# Patient Record
Sex: Female | Born: 1995 | Race: Black or African American | Hispanic: No | Marital: Single | Smoking: Never smoker
Health system: Southern US, Community
[De-identification: ages and names within clinical notes are randomized; demographics above are authoritative.]

## PROBLEM LIST (undated history)

## (undated) DIAGNOSIS — L309 Dermatitis, unspecified: Secondary | ICD-10-CM

---

## 2019-07-25 ENCOUNTER — Other Ambulatory Visit: Payer: Self-pay

## 2019-07-25 ENCOUNTER — Emergency Department (HOSPITAL_COMMUNITY)
Admission: EM | Admit: 2019-07-25 | Discharge: 2019-07-25 | Disposition: A | Discharge status: Home / Self Care | Payer: Medicaid Other | Attending: Emergency Medicine | Admitting: Emergency Medicine

## 2019-07-25 ENCOUNTER — Encounter (HOSPITAL_COMMUNITY): Payer: Self-pay | Admitting: Emergency Medicine

## 2019-07-25 ENCOUNTER — Emergency Department (HOSPITAL_COMMUNITY): Payer: Medicaid Other

## 2019-07-25 DIAGNOSIS — R109 Unspecified abdominal pain: Secondary | ICD-10-CM | POA: Insufficient documentation

## 2019-07-25 DIAGNOSIS — M545 Low back pain, unspecified: Secondary | ICD-10-CM

## 2019-07-25 HISTORY — DX: Dermatitis, unspecified: L30.9

## 2019-07-25 LAB — COMPREHENSIVE METABOLIC PANEL
ALT: 14 U/L (ref 0–44)
AST: 16 U/L (ref 15–41)
Albumin: 4.1 g/dL (ref 3.5–5.0)
Alkaline Phosphatase: 51 U/L (ref 38–126)
Anion gap: 9 (ref 5–15)
BUN: 7 mg/dL (ref 6–20)
CO2: 23 mmol/L (ref 22–32)
Calcium: 9.2 mg/dL (ref 8.9–10.3)
Chloride: 104 mmol/L (ref 98–111)
Creatinine, Ser: 0.7 mg/dL (ref 0.44–1.00)
GFR calc Af Amer: >60 mL/min (ref >60)
GFR calc non Af Amer: >60 mL/min (ref >60)
Glucose, Bld: 87 mg/dL (ref 70–99)
Potassium: 3.8 mmol/L (ref 3.5–5.1)
Sodium: 136 mmol/L (ref 135–145)
Total Bilirubin: 0.8 mg/dL (ref 0.3–1.2)
Total Protein: 7.2 g/dL (ref 6.5–8.1)

## 2019-07-25 LAB — CBC WITH DIFFERENTIAL/PLATELET
Abs Immature Granulocytes: 0.02 10*3/uL (ref 0.00–0.07)
Basophils Absolute: 0 10*3/uL (ref 0.0–0.1)
Basophils Relative: 0 %
Eosinophils Absolute: 0.3 10*3/uL (ref 0.0–0.5)
Eosinophils Relative: 6 %
HCT: 40.8 % (ref 36.0–46.0)
Hemoglobin: 13.5 g/dL (ref 12.0–15.0)
Immature Granulocytes: 0 %
Lymphocytes Relative: 23 %
Lymphs Abs: 1.3 10*3/uL (ref 0.7–4.0)
MCH: 29.9 pg (ref 26.0–34.0)
MCHC: 33.1 g/dL (ref 30.0–36.0)
MCV: 90.3 fL (ref 80.0–100.0)
Monocytes Absolute: 0.4 10*3/uL (ref 0.1–1.0)
Monocytes Relative: 6 %
Neutro Abs: 3.7 10*3/uL (ref 1.7–7.7)
Neutrophils Relative %: 65 %
Platelets: 304 10*3/uL (ref 150–400)
RBC: 4.52 MIL/uL (ref 3.87–5.11)
RDW: 12.3 % (ref 11.5–15.5)
WBC: 5.7 10*3/uL (ref 4.0–10.5)
nRBC: 0 % (ref 0.0–0.2)

## 2019-07-25 LAB — URINALYSIS, ROUTINE W REFLEX MICROSCOPIC
Bilirubin Urine: NEGATIVE
Glucose, UA: NEGATIVE mg/dL
Hgb urine dipstick: NEGATIVE
Ketones, ur: NEGATIVE mg/dL
Leukocytes,Ua: NEGATIVE
Nitrite: NEGATIVE
Protein, ur: NEGATIVE mg/dL
Specific Gravity, Urine: 1.018 (ref 1.005–1.030)
pH: 6 (ref 5.0–8.0)

## 2019-07-25 LAB — POC URINE PREG, ED: Preg Test, Ur: NEGATIVE

## 2019-07-25 MED ORDER — METHOCARBAMOL 500 MG PO TABS: 500.0000 mg | ORAL_TABLET | Freq: Two times a day (BID) | ORAL | 0 refills | Status: AC | PRN

## 2019-07-25 MED ORDER — HYDROCODONE-ACETAMINOPHEN 5-325 MG PO TABS
1.0000 | ORAL_TABLET | Freq: Once | ORAL | Status: AC
  Administered 2019-07-25: 15:00:00 1 via ORAL
  Filled 2019-07-25: qty 1

## 2019-07-25 NOTE — ED Triage Notes (Signed)
Pt reports L lower back pain since Thursday. Pt reports receiving a massage on Thursday, having pain since. She was seen at her [place], had a UA done, negative. Pt reports taking ibuprofen with some relief.

## 2019-07-25 NOTE — Discharge Instructions (Signed)
It was my pleasure taking care of you today!   You have been seen in the Emergency Department today for back pain.   Continue ibuprofen as needed for pain. You can add in extra strength Tylenol for additional pain relief. Robaxin is your muscle relaxer to take as needed.  Your back pain should get better over the next 2 weeks. Please follow up with your doctor this week for a recheck if still having symptoms.  COLD THERAPY DIRECTIONS:  Ice or gel packs can be used to reduce both pain and swelling. Ice is the most helpful within the first 24 to 48 hours after an injury or flareup from overusing a muscle or joint.  Ice is effective, has very few side effects, and is safe for most people to use.    Return to the ED for worsening back pain, fever, weakness or numbness of either leg, or if you develop either (1) an inability to urinate or have bowel movements, or (2) loss of your ability to control your bathroom functions (if you start having "accidents"), or if you develop other new symptoms that concern you.

## 2019-07-25 NOTE — ED Provider Notes (Signed)
[facility] EMERGENCY DEPARTMENT Provider Note   CSN: [id number] Arrival date & time: 07/25/19  1231     History   Chief Complaint Chief Complaint  Patient presents with  . Back Pain    HPI Christina Becker is a 23 y.o. female.     The history is provided by the patient and medical records. No language interpreter was used.   Christina Becker is a 23 y.o. female  with no pertinent PMH who presents to the Emergency Department complaining of left sided low back pain radiating to left flank for 4-5 days.  Patient states that she did receive a massage on Thursday, but no other inciting event or trauma.  [facility] she had normal urinalysis done.  She was told that everything looked good and to take ibuprofen if she needs to for the pain.  She has been doing so, but providing her minimal relief.  She states that her pain is now more to the flank and it was earlier and feels like the pain is now moving towards her stomach.  Denies any dysuria. Patient denies upper back or neck pain. No fever, saddle anesthesia, weakness, numbness, no urinary retention/incontinence. No history of cancer, IVDU, or recent spinal procedures.   Past Medical History:  Diagnosis Date  . Eczema     There are no active problems to display for this patient.   History reviewed. No pertinent surgical history.   OB History   No obstetric history on file.      Home Medications    Prior to Admission medications   Medication Sig Start Date End Date Taking? Authorizing Provider  methocarbamol (ROBAXIN) 500 MG tablet Take 1 tablet (500 mg total) by mouth 2 (two) times daily as needed. 07/25/19   , Ozella Almond, PA-C    Family History No family history on file.  Social History Social History   Tobacco Use  . Smoking status: Never Smoker  . Smokeless tobacco: Never Used  Substance Use Topics  . Alcohol use: Yes    Comment: occasional  .  Drug use: Never     Allergies   Shellfish allergy   Review of Systems Review of Systems  Genitourinary: Positive for flank pain. Negative for dysuria and pelvic pain.  Musculoskeletal: Positive for back pain. Negative for neck pain.  All other systems reviewed and are negative.    Physical Exam Updated Vital Signs BP 113/74 (BP Location: Right Arm)   Pulse 71   Temp 98.6 F (37 C) (Oral)   Resp 16   Ht 5\' 4"  (1.626 m)   Wt 57.2 kg   LMP 06/29/2019 (Approximate)   SpO2 99%   BMI 21.63 kg/m   Physical Exam Vitals signs and nursing note reviewed.  Constitutional:      General: She is not in acute distress.    Appearance: She is well-developed.  HENT:     Head: Normocephalic and atraumatic.  Neck:     Musculoskeletal: Neck supple.  Cardiovascular:     Rate and Rhythm: Normal rate and regular rhythm.     Heart sounds: Normal heart sounds. No murmur.  Pulmonary:     Effort: Pulmonary effort is normal. No respiratory distress.     Breath sounds: Normal breath sounds.  Abdominal:     General: There is no distension.     Palpations: Abdomen is soft.     Comments: No overt CVA tenderness.  She has tender to the left flank without overlying skin changes.  No abdominal tenderness.  Skin:    General: Skin is warm and dry.  Neurological:     Mental Status: She is alert and oriented to person, place, and time.      ED Treatments / Results  Labs (all labs ordered are listed, but only abnormal results are displayed) Labs Reviewed  CBC WITH DIFFERENTIAL/PLATELET  COMPREHENSIVE METABOLIC PANEL  URINALYSIS, ROUTINE W REFLEX MICROSCOPIC  POC URINE PREG, ED    EKG None  Radiology Ct Renal Stone Study  Result Date: 07/25/2019 CLINICAL DATA:  23 year old female with acute LEFT abdominal flank pain for 4 days. EXAM: CT ABDOMEN AND PELVIS WITHOUT CONTRAST TECHNIQUE: Multidetector CT imaging of the abdomen and pelvis was performed following the standard protocol without  IV contrast. COMPARISON:  None. FINDINGS: Please note that parenchymal abnormalities may be missed without intravenous contrast. Lower chest: Unremarkable Hepatobiliary: The liver and gallbladder are unremarkable. No biliary dilatation. Pancreas: Unremarkable Spleen: Unremarkable Adrenals/Urinary Tract: The kidneys, adrenal glands and bladder are unremarkable. Stomach/Bowel: Stomach is within normal limits. The appendix is not identified but no inflammatory changes are noted within the pericecal region. No evidence of bowel wall thickening, distention, or inflammatory changes. Vascular/Lymphatic: No significant vascular findings are present. No enlarged abdominal or pelvic lymph nodes. Reproductive: Uterus and bilateral adnexa are unremarkable. Other: A trace amount of free pelvic fluid is noted and may be physiologic. Musculoskeletal: No bony abnormalities are identified. IMPRESSION: 1. Trace amount of free pelvic fluid which may be physiologic. Otherwise unremarkable CT of the abdomen and pelvis without contrast. Electronically Signed   By: Harmon PierJeffrey  Hu M.D.   On: 07/25/2019 17:03    Procedures Procedures (including critical care time)  Medications Ordered in ED Medications  HYDROcodone-acetaminophen (NORCO/VICODIN) 5-325 MG per tablet 1 tablet (1 tablet Oral Given 07/25/19 1437)     Initial Impression / Assessment and Plan / ED Course  I have reviewed the triage vital signs and the nursing notes.  Pertinent labs & imaging results that were available during my care of the patient were reviewed by me and considered in my medical decision making (see chart for details).       Christina Becker is a 23 y.o. female who presents to ED for low back and flank pain.  No red flag symptoms of back pain.  Neurovascularly intact.  No abdominal tenderness, but is tender to the flank.  Labs and urine unremarkable.  Renal study without stone or other acute pathology.  Likely MSK.  Will treat as such and have her  follow-up with PCP.  Reasons to return to the emergency department were discussed and all questions answered.    Final Clinical Impressions(s) / ED Diagnoses   Final diagnoses:  Acute left-sided low back pain without sciatica    ED Discharge Orders         Ordered    methocarbamol (ROBAXIN) 500 MG tablet  2 times daily PRN     07/25/19 1725           , Chase PicketJaime Pilcher, PA-C 07/25/19 1728    Milagros Lollykstra, Richard S, MD 07/26/19 [phone]

## 2020-09-21 ENCOUNTER — Ambulatory Visit (HOSPITAL_COMMUNITY)
Admission: EM | Admit: 2020-09-21 | Discharge: 2020-09-21 | Disposition: A | Discharge status: Home / Self Care | Payer: Medicaid Other

## 2020-09-21 ENCOUNTER — Other Ambulatory Visit: Payer: Self-pay

## 2021-03-22 IMAGING — CT CT RENAL STONE PROTOCOL
2 of 4 series · 16 of 46 positions shown, 18 images · non-contrast
Comparison: None.

CLINICAL DATA: 22-year-old female with acute LEFT abdominal flank
pain for 4 days.

EXAM:
CT ABDOMEN AND PELVIS WITHOUT CONTRAST
TECHNIQUE: Multidetector CT imaging of the abdomen and pelvis was performed
following the standard protocol without IV contrast.

[Series 3: renal stone 5.0 · axial · 0.68mm/px · z∈[+796,+1196]mm · 13 of 88 slices shown, 15 images]
[im 4/88  soft-tissue]
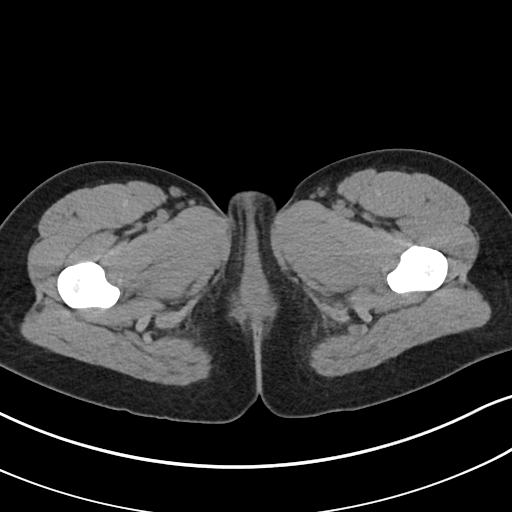
[im 4/88  bone]
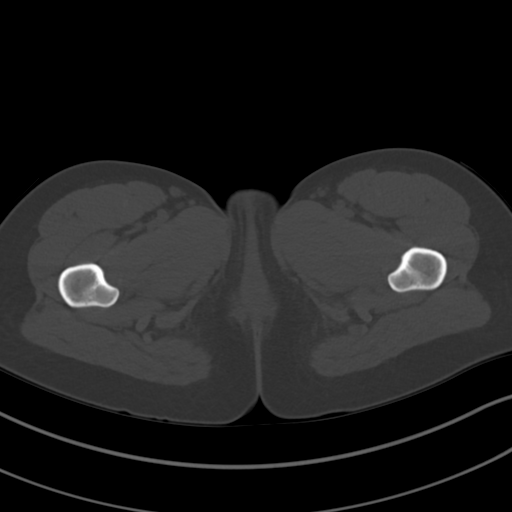
[im 11/88  soft-tissue]
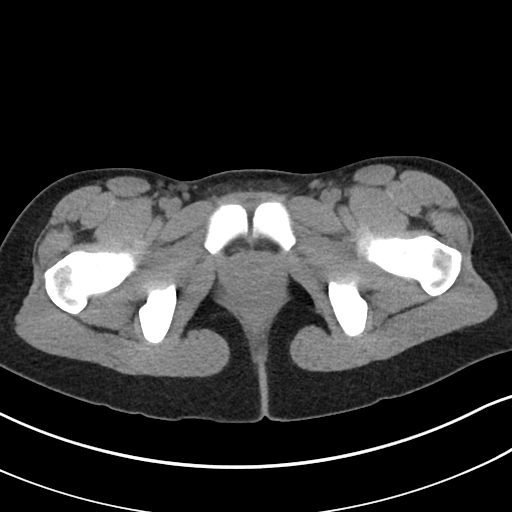
[im 17/88  soft-tissue]
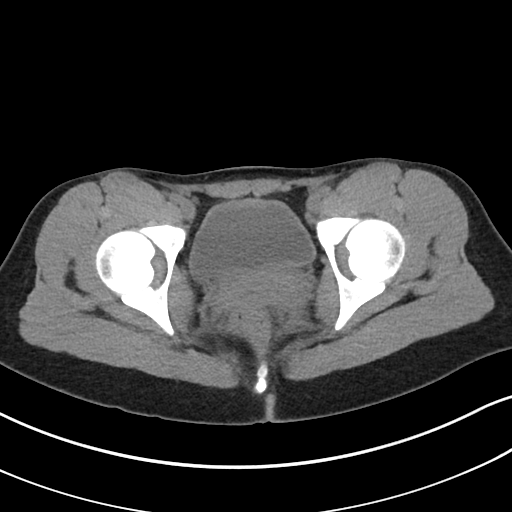
[im 24/88  soft-tissue]
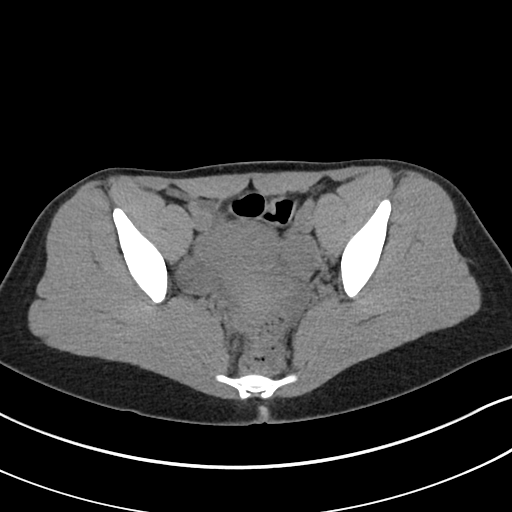
[im 31/88  soft-tissue]
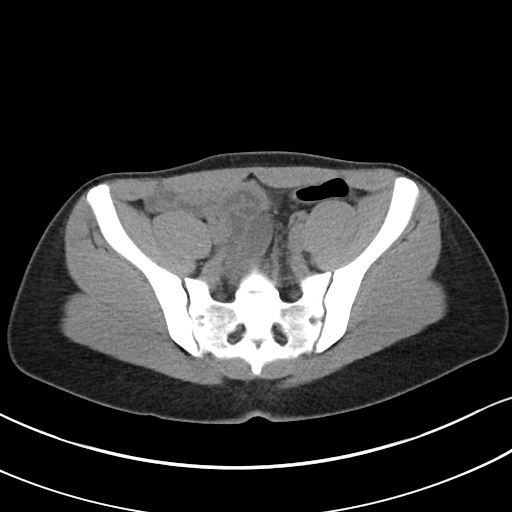
[im 37/88  soft-tissue]
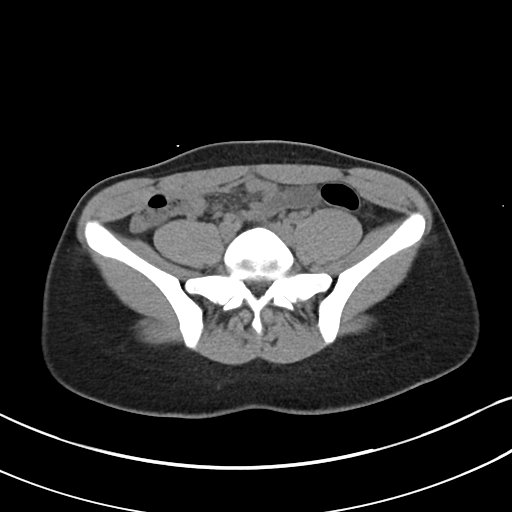
[im 44/88  soft-tissue]
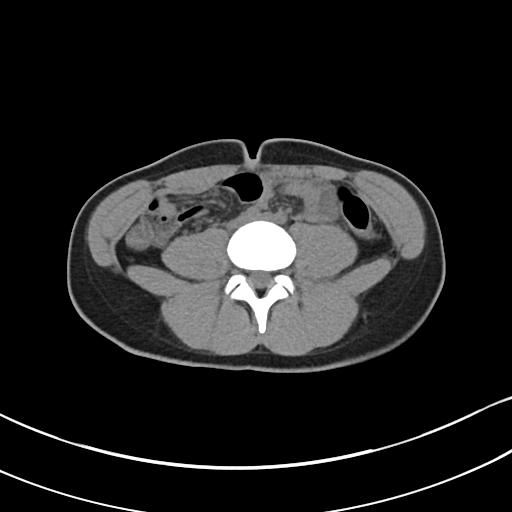
[im 51/88  soft-tissue]
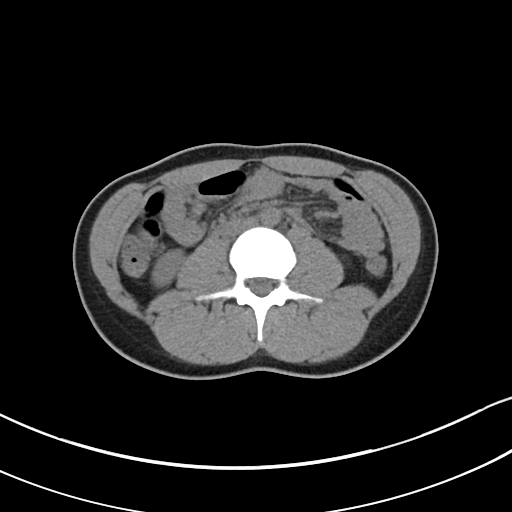
[im 57/88  soft-tissue]
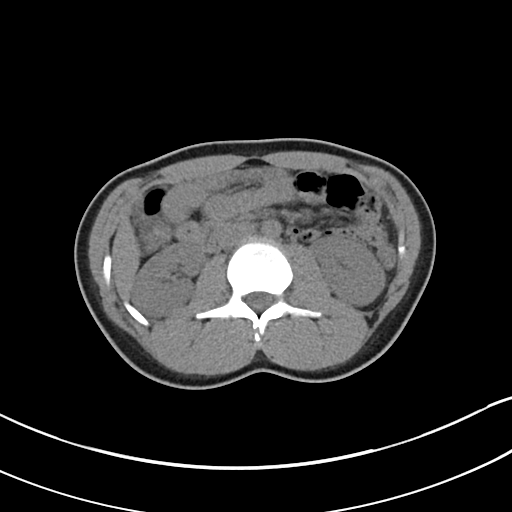
[im 57/88  bone]
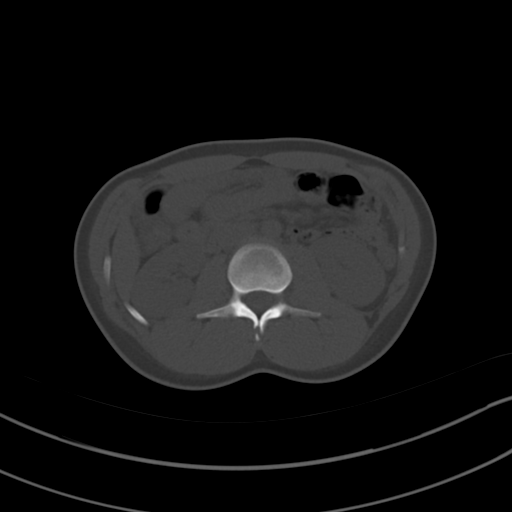
[im 64/88  soft-tissue]
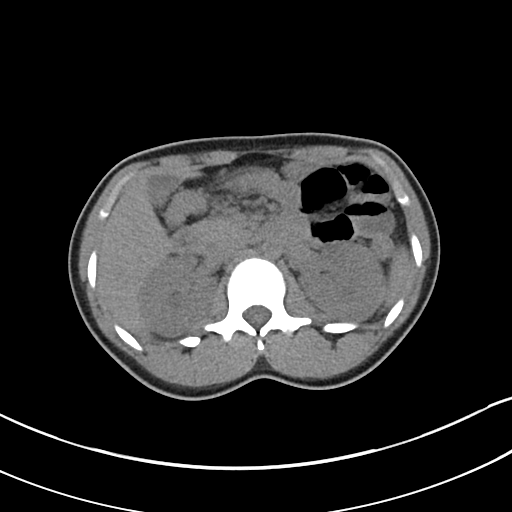
[im 71/88  soft-tissue]
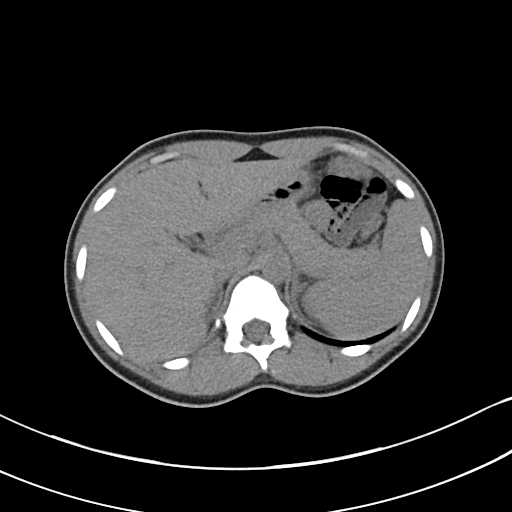
[im 77/88  soft-tissue]
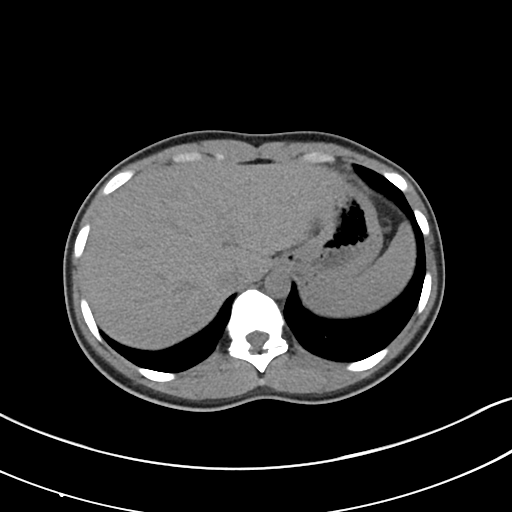
[im 84/88  soft-tissue]
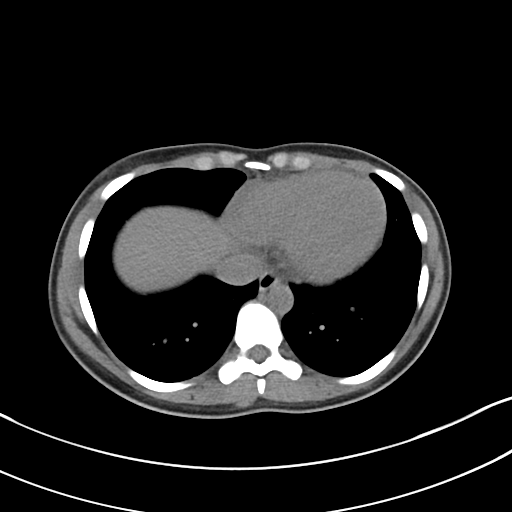

[Series 5: renal stone 3.0 cor · coronal · 0.64mm/px · 3 of 68 slices shown]
[im 23/68  soft-tissue]
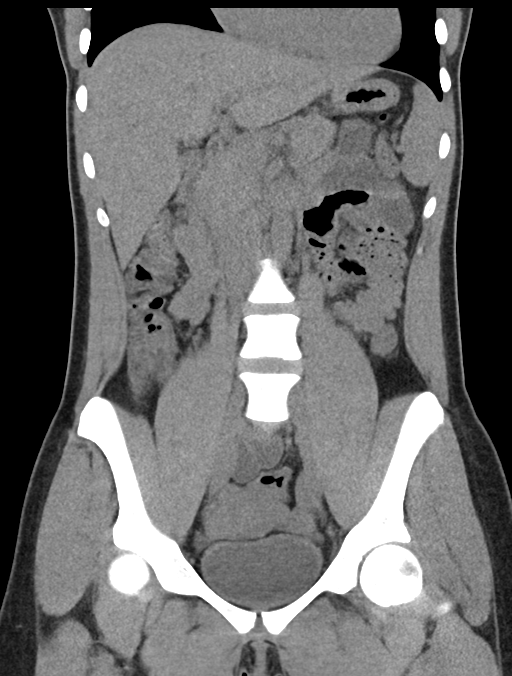
[im 30/68  soft-tissue]
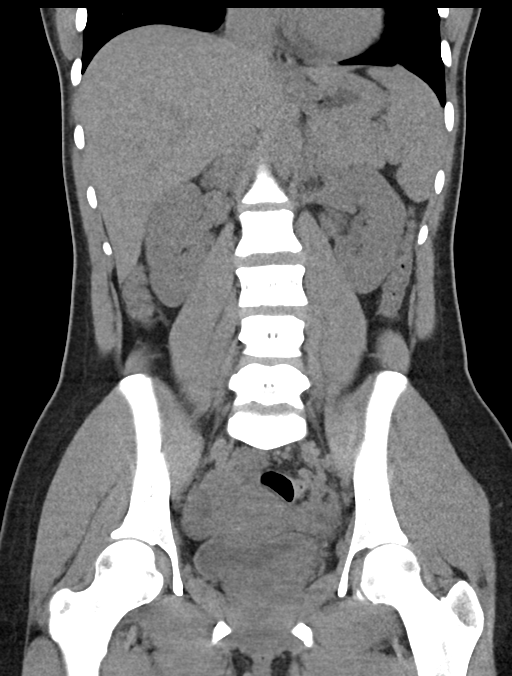
[im 38/68  soft-tissue]
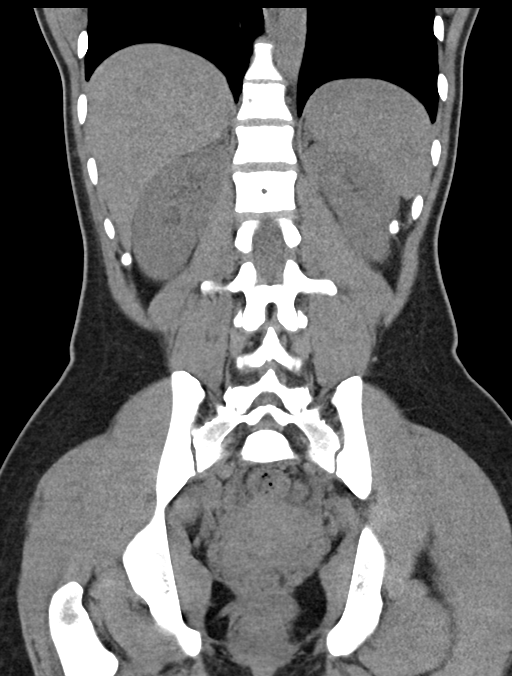

[16 of 46 positions shown; findings below may reference images not displayed]

FINDINGS: Please note that parenchymal abnormalities may be missed without
intravenous contrast.

Lower chest: Unremarkable

Hepatobiliary: The liver and gallbladder are unremarkable. No
biliary dilatation.

Pancreas: Unremarkable

Spleen: Unremarkable

Adrenals/Urinary Tract: The kidneys, adrenal glands and bladder are
unremarkable.

Stomach/Bowel: Stomach is within normal limits. The appendix is not
identified but no inflammatory changes are noted within the
pericecal region. No evidence of bowel wall thickening, distention,
or inflammatory changes.

Vascular/Lymphatic: No significant vascular findings are present. No
enlarged abdominal or pelvic lymph nodes.

Reproductive: Uterus and bilateral adnexa are unremarkable.

Other: A trace amount of free pelvic fluid is noted and may be
physiologic.

Musculoskeletal: No bony abnormalities are identified.
IMPRESSION: 1. Trace amount of free pelvic fluid which may be physiologic.
Otherwise unremarkable CT of the abdomen and pelvis without
contrast.
